# Patient Record
Sex: Female | Born: 1955 | Race: White | Hispanic: No | Marital: Married | Smoking: Former smoker
Health system: Southern US, Community
[De-identification: ages and names within clinical notes are randomized; demographics above are authoritative.]

---

## 1998-04-07 ENCOUNTER — Ambulatory Visit (HOSPITAL_COMMUNITY): Admission: RE | Admit: 1998-04-07 | Discharge: 1998-04-07 | Payer: Self-pay | Admitting: *Deleted

## 1998-12-03 ENCOUNTER — Other Ambulatory Visit: Admission: RE | Admit: 1998-12-03 | Discharge: 1998-12-03 | Payer: Self-pay | Admitting: *Deleted

## 2000-03-16 ENCOUNTER — Other Ambulatory Visit: Admission: RE | Admit: 2000-03-16 | Discharge: 2000-03-16 | Payer: Self-pay | Admitting: *Deleted

## 2000-08-15 ENCOUNTER — Ambulatory Visit (HOSPITAL_COMMUNITY): Admission: RE | Admit: 2000-08-15 | Discharge: 2000-08-15 | Payer: Self-pay | Admitting: *Deleted

## 2000-08-15 ENCOUNTER — Encounter: Payer: Self-pay | Admitting: *Deleted

## 2001-09-19 ENCOUNTER — Encounter: Payer: Self-pay | Admitting: *Deleted

## 2001-09-19 ENCOUNTER — Ambulatory Visit (HOSPITAL_COMMUNITY): Admission: RE | Admit: 2001-09-19 | Discharge: 2001-09-19 | Payer: Self-pay | Admitting: *Deleted

## 2002-02-19 ENCOUNTER — Other Ambulatory Visit: Admission: RE | Admit: 2002-02-19 | Discharge: 2002-02-19 | Payer: Self-pay | Admitting: *Deleted

## 2003-02-03 ENCOUNTER — Ambulatory Visit (HOSPITAL_COMMUNITY): Admission: RE | Admit: 2003-02-03 | Discharge: 2003-02-03 | Payer: Self-pay | Admitting: *Deleted

## 2003-02-03 ENCOUNTER — Encounter: Payer: Self-pay | Admitting: *Deleted

## 2004-07-07 ENCOUNTER — Ambulatory Visit (HOSPITAL_COMMUNITY): Admission: RE | Admit: 2004-07-07 | Discharge: 2004-07-07 | Payer: Self-pay | Admitting: *Deleted

## 2005-05-31 ENCOUNTER — Other Ambulatory Visit: Admission: RE | Admit: 2005-05-31 | Discharge: 2005-05-31 | Payer: Self-pay | Admitting: *Deleted

## 2006-06-06 ENCOUNTER — Other Ambulatory Visit: Admission: RE | Admit: 2006-06-06 | Discharge: 2006-06-06 | Payer: Self-pay | Admitting: Family Medicine

## 2006-09-03 ENCOUNTER — Ambulatory Visit (HOSPITAL_COMMUNITY): Admission: RE | Admit: 2006-09-03 | Discharge: 2006-09-03 | Payer: Self-pay | Admitting: Obstetrics and Gynecology

## 2007-11-20 ENCOUNTER — Ambulatory Visit (HOSPITAL_COMMUNITY): Admission: RE | Admit: 2007-11-20 | Discharge: 2007-11-20 | Payer: Self-pay | Admitting: Family Medicine

## 2008-11-27 ENCOUNTER — Ambulatory Visit (HOSPITAL_COMMUNITY): Admission: RE | Admit: 2008-11-27 | Discharge: 2008-11-27 | Payer: Self-pay | Admitting: Gastroenterology

## 2010-12-30 ENCOUNTER — Ambulatory Visit (HOSPITAL_COMMUNITY)
Admission: RE | Admit: 2010-12-30 | Discharge: 2010-12-30 | Payer: Self-pay | Source: Home / Self Care | Attending: Obstetrics | Admitting: Obstetrics

## 2011-05-02 NOTE — Op Note (Signed)
Sabrina Sabrina [name], Sabrina [name] NO.:  [id number]   MEDICAL RECORD NO.:  [id number]          PATIENT TYPE:  AMB   LOCATION:  ENDO                         FACILITY[facility]  [facility]   PHYSICIAN:  Shirley Friar, MDDATE OF BIRTH:  02/15/1956   DATE OF PROCEDURE:  DATE OF DISCHARGE:                               OPERATIVE REPORT   INDICATIONS:  Screening.   MEDICATIONS:  Fentanyl 125 mcg IV, Versed 12.5 mg IV, Phenergan 6.25 mg  IV.   FINDINGS:  Rectal exam was normal.  Pediatric Pentax colonoscope was  inserted into a well-prepped colon and advanced to the cecum where the  ileocecal valve and appendiceal orifice were identified.  A repeated  loop reduction was necessary in order to reach the cecum due to  redundant colon on the left side of the colon.  The terminal ileum was  intubated and was normal in appearance.  On careful withdrawal of the  colonoscope no mucosal abnormalities were seen.  Retroflexion was  unremarkable.   ASSESSMENT:  Normal colonoscopy.   PLAN:  Repeat colonoscopy in 10 years.  Check FOBT in 5 years.      Shirley Friar, MD  Electronically Signed     VCS/MEDQ  D:  11/27/2008  T:  11/27/2008  Job:  [id number]   cc:   Molly Maduro L. Foy [place], M.D.  Fax: [phone]

## 2012-03-29 ENCOUNTER — Other Ambulatory Visit (HOSPITAL_COMMUNITY): Payer: Self-pay | Admitting: Nurse Practitioner

## 2012-03-29 DIAGNOSIS — Z1231 Encounter for screening mammogram for malignant neoplasm of breast: Secondary | ICD-10-CM

## 2012-04-25 ENCOUNTER — Ambulatory Visit (HOSPITAL_COMMUNITY)
Admission: RE | Admit: 2012-04-25 | Discharge: 2012-04-25 | Disposition: A | Discharge status: Home / Self Care | Payer: BC Managed Care – PPO | Source: Ambulatory Visit | Attending: Nurse Practitioner | Admitting: Nurse Practitioner

## 2012-04-25 DIAGNOSIS — Z1231 Encounter for screening mammogram for malignant neoplasm of breast: Secondary | ICD-10-CM | POA: Insufficient documentation

## 2014-03-10 ENCOUNTER — Other Ambulatory Visit (HOSPITAL_COMMUNITY): Payer: Self-pay | Admitting: Family Medicine

## 2014-03-10 DIAGNOSIS — Z1231 Encounter for screening mammogram for malignant neoplasm of breast: Secondary | ICD-10-CM

## 2014-03-12 ENCOUNTER — Ambulatory Visit (HOSPITAL_COMMUNITY)
Admission: RE | Admit: 2014-03-12 | Discharge: 2014-03-12 | Disposition: A | Discharge status: Home / Self Care | Payer: BC Managed Care – PPO | Source: Ambulatory Visit | Attending: Family Medicine | Admitting: Family Medicine

## 2014-03-12 DIAGNOSIS — Z1231 Encounter for screening mammogram for malignant neoplasm of breast: Secondary | ICD-10-CM | POA: Insufficient documentation

## 2014-06-17 ENCOUNTER — Encounter: Payer: Self-pay | Admitting: Pulmonary Disease

## 2014-06-17 ENCOUNTER — Ambulatory Visit (INDEPENDENT_AMBULATORY_CARE_PROVIDER_SITE_OTHER): Payer: BC Managed Care – PPO | Admitting: Pulmonary Disease

## 2014-06-17 VITALS — BP 122/78 | HR 68 | Temp 98.0°F | Ht 66.0 in | Wt 143.0 lb

## 2014-06-17 DIAGNOSIS — G4700 Insomnia, unspecified: Secondary | ICD-10-CM

## 2014-06-17 DIAGNOSIS — G478 Other sleep disorders: Secondary | ICD-10-CM

## 2014-06-17 NOTE — Progress Notes (Signed)
Subjective:    Patient ID: Sabrina [name], female    DOB: Apr 25, 1956, 58 y.o.   MRN: [id number]  HPI  58 year old woman presents for evaluation of sleep disordered breathing. She reports restless and non-refreshing sleep and frequent nocturnal arousals. She reports feeling sluggish On awakening since her teenage years. She reports nocturnal awakenings increase during menopause and have persisted since. 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 She drinks a glass of wine every evening and has been trying to cut down. There is no history of witnessed apneas. Mild snoring has been reported by her husband. She reports occasional leg cramps that have improved with hydration. She tried Ambien for some time which reduced sleep onset, she felt that she was getting immune to this. Bedtime is around 11 PM no, she sleeps for 10-20 minutes on her ipad, sleep latency is minimal, she sleeps on her side with one pillow, reports 4-5 awakenings with occasional bathroom visits, without post void sleep latency, is out of bed by 7 AM feeling sluggish and heavyheaded. Her husband brings her coffee in bed.  She drinks 2-3 cups of coffee in the morning before she gets going. On weekends bedtime is around midnight and she wakes up by 8 AM, but feels sluggish regardless.  There is no history suggestive of cataplexy, sleep paralysis or parasomnias   No past medical history on file.  No past surgical history on file.  No Known Allergies  History   Social History  . Marital Status: Married    Spouse Name: N/A    Number of Children: 2  . Years of Education: N/A   Occupational History  . [employer]    Social History Main Topics  . Smoking status: Former Smoker -- 1.50 packs/day for 5 years    Types: Cigarettes    Quit date: 12/18/1976  . Smokeless tobacco: Not on file  . Alcohol Use: Yes     Comment: 3-4 days of week red  wine  . Drug Use: No  . Sexual Activity: Not on file   Other Topics Concern  . Not on file   Social History Narrative  . No narrative on file    Family History  Problem Relation Age of Onset  . Heart disease Mother   . Diabetes Mother     Review of Systems  Constitutional: Negative for fever and unexpected weight change.  HENT: Negative for congestion, dental problem, ear pain, nosebleeds, postnasal drip, rhinorrhea, sinus pressure, sneezing, sore throat and trouble swallowing.   Eyes: Negative for redness and itching.  Respiratory: Positive for shortness of breath. Negative for cough, chest tightness and wheezing.   Cardiovascular: Negative for palpitations and leg swelling.  Gastrointestinal: Negative for nausea and vomiting.  Genitourinary: Negative for dysuria.  Musculoskeletal: Negative for joint swelling.  Skin: Negative for rash.  Neurological: Negative for headaches.  Hematological: Does not bruise/bleed easily.  Psychiatric/Behavioral: Negative for dysphoric mood. The patient is not nervous/anxious.        Objective:   Physical Exam  Gen. Pleasant, well-nourished, in no distress, normal affect ENT - no lesions, no post nasal drip Neck: No JVD, no thyromegaly, no carotid bruits Lungs: no use of accessory muscles, no dullness to percussion, clear without rales or rhonchi  Cardiovascular: Rhythm regular, heart sounds  normal, no murmurs or gallops, no peripheral edema Abdomen: soft and non-tender, no hepatosplenomegaly, BS normal. Musculoskeletal: No deformities, no cyanosis or  clubbing Neuro:  alert, non focal       Assessment & Plan:

## 2014-06-17 NOTE — Assessment & Plan Note (Signed)
I'm not unable to pin point the cause of her frequent awakenings. She does not seem to have obvious sleep disordered breathing, does not have witnessed apneas-although snoring and unrefreshing sleep may be an indicator. She does not have a history suggestive of restless leg syndrome. Her sleep quantity appears okay, she may require more than 8 hours of sleep every night. We discussed sleep hygiene -work hours - alcohol at bedtime -exercise -light exposure -work balance -She has tried melatonin without significant benefits in the past  She will call us back in the future if problems persist and we will proceed with a sleep study.

## 2014-06-17 NOTE — Patient Instructions (Signed)
We discussed sleep hygiene -work hours - alcohol at bedtime -exercise -light exposure -work balance

## 2014-07-13 ENCOUNTER — Other Ambulatory Visit: Payer: Self-pay | Admitting: Nurse Practitioner

## 2014-07-13 DIAGNOSIS — Z780 Asymptomatic menopausal state: Secondary | ICD-10-CM

## 2016-05-08 ENCOUNTER — Other Ambulatory Visit: Payer: Self-pay

## 2016-05-08 DIAGNOSIS — Z1231 Encounter for screening mammogram for malignant neoplasm of breast: Secondary | ICD-10-CM

## 2016-05-26 ENCOUNTER — Ambulatory Visit
Admission: RE | Admit: 2016-05-26 | Discharge: 2016-05-26 | Disposition: A | Discharge status: Home / Self Care | Payer: BLUE CROSS/BLUE SHIELD | Source: Ambulatory Visit

## 2016-05-26 DIAGNOSIS — Z1231 Encounter for screening mammogram for malignant neoplasm of breast: Secondary | ICD-10-CM

## 2017-10-12 ENCOUNTER — Other Ambulatory Visit: Payer: Self-pay | Admitting: Family Medicine

## 2017-10-12 DIAGNOSIS — Z139 Encounter for screening, unspecified: Secondary | ICD-10-CM

## 2017-11-05 ENCOUNTER — Ambulatory Visit
Admission: RE | Admit: 2017-11-05 | Discharge: 2017-11-05 | Disposition: A | Discharge status: Home / Self Care | Payer: BLUE CROSS/BLUE SHIELD | Source: Ambulatory Visit | Attending: Family Medicine | Admitting: Family Medicine

## 2017-11-05 DIAGNOSIS — Z139 Encounter for screening, unspecified: Secondary | ICD-10-CM

## 2020-10-11 ENCOUNTER — Other Ambulatory Visit: Payer: Self-pay | Admitting: Physician Assistant

## 2020-10-11 DIAGNOSIS — E2839 Other primary ovarian failure: Secondary | ICD-10-CM

## 2020-10-11 DIAGNOSIS — Z1231 Encounter for screening mammogram for malignant neoplasm of breast: Secondary | ICD-10-CM

## 2021-01-24 ENCOUNTER — Other Ambulatory Visit: Payer: BLUE CROSS/BLUE SHIELD

## 2021-01-24 ENCOUNTER — Ambulatory Visit: Payer: BLUE CROSS/BLUE SHIELD

## 2021-01-26 ENCOUNTER — Ambulatory Visit
Admission: RE | Admit: 2021-01-26 | Discharge: 2021-01-26 | Disposition: A | Discharge status: Home / Self Care | Payer: Medicare Other | Source: Ambulatory Visit | Attending: Physician Assistant | Admitting: Physician Assistant

## 2021-01-26 ENCOUNTER — Other Ambulatory Visit: Payer: Self-pay

## 2021-01-26 DIAGNOSIS — E2839 Other primary ovarian failure: Secondary | ICD-10-CM

## 2021-02-08 ENCOUNTER — Other Ambulatory Visit: Payer: Self-pay

## 2021-02-08 ENCOUNTER — Ambulatory Visit
Admission: RE | Admit: 2021-02-08 | Discharge: 2021-02-08 | Disposition: A | Discharge status: Home / Self Care | Payer: Medicare Other | Source: Ambulatory Visit | Attending: Physician Assistant | Admitting: Physician Assistant

## 2021-02-08 DIAGNOSIS — Z1231 Encounter for screening mammogram for malignant neoplasm of breast: Secondary | ICD-10-CM

## 2021-03-03 ENCOUNTER — Ambulatory Visit: Payer: BLUE CROSS/BLUE SHIELD

## 2021-06-06 ENCOUNTER — Other Ambulatory Visit: Payer: BLUE CROSS/BLUE SHIELD

## 2022-03-30 ENCOUNTER — Other Ambulatory Visit: Payer: Self-pay | Admitting: Physician Assistant

## 2022-03-30 DIAGNOSIS — Z1231 Encounter for screening mammogram for malignant neoplasm of breast: Secondary | ICD-10-CM

## 2022-04-11 ENCOUNTER — Ambulatory Visit
Admission: RE | Admit: 2022-04-11 | Discharge: 2022-04-11 | Disposition: A | Discharge status: Home / Self Care | Payer: Medicare Other | Source: Ambulatory Visit | Attending: Physician Assistant | Admitting: Physician Assistant

## 2022-04-11 DIAGNOSIS — Z1231 Encounter for screening mammogram for malignant neoplasm of breast: Secondary | ICD-10-CM

## 2023-04-16 ENCOUNTER — Other Ambulatory Visit: Payer: Self-pay | Admitting: Physician Assistant

## 2023-04-16 DIAGNOSIS — Z1231 Encounter for screening mammogram for malignant neoplasm of breast: Secondary | ICD-10-CM

## 2023-05-22 ENCOUNTER — Ambulatory Visit: Payer: Medicare Other

## 2023-11-09 IMAGING — MG MM DIGITAL SCREENING BILAT W/ TOMO AND CAD
6 of 10 series · 6 of 30 positions shown · non-contrast
Comparison: Previous exam(s).

CLINICAL DATA: Screening.

EXAM:
DIGITAL SCREENING BILATERAL MAMMOGRAM WITH TOMOSYNTHESIS AND CAD
TECHNIQUE: Bilateral screening digital craniocaudal and mediolateral oblique
mammograms were obtained. Bilateral screening digital breast
tomosynthesis was performed. The images were evaluated with
computer-aided detection.

[L CC synth-2D]
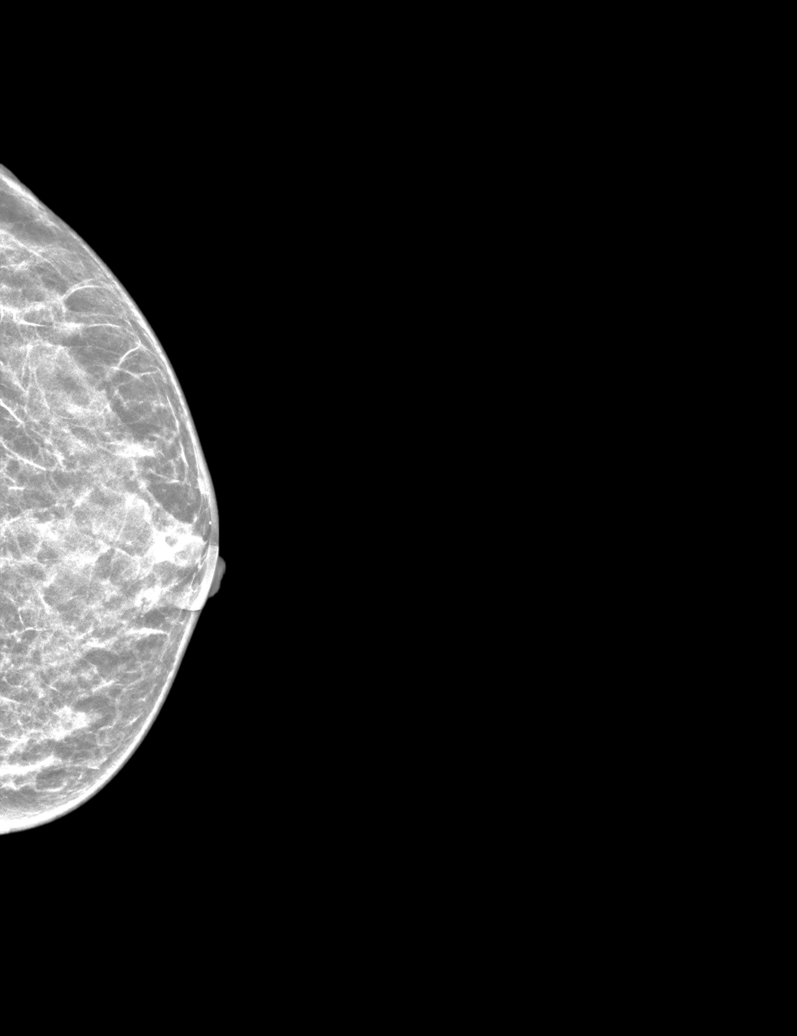

[R MLO synth-2D]
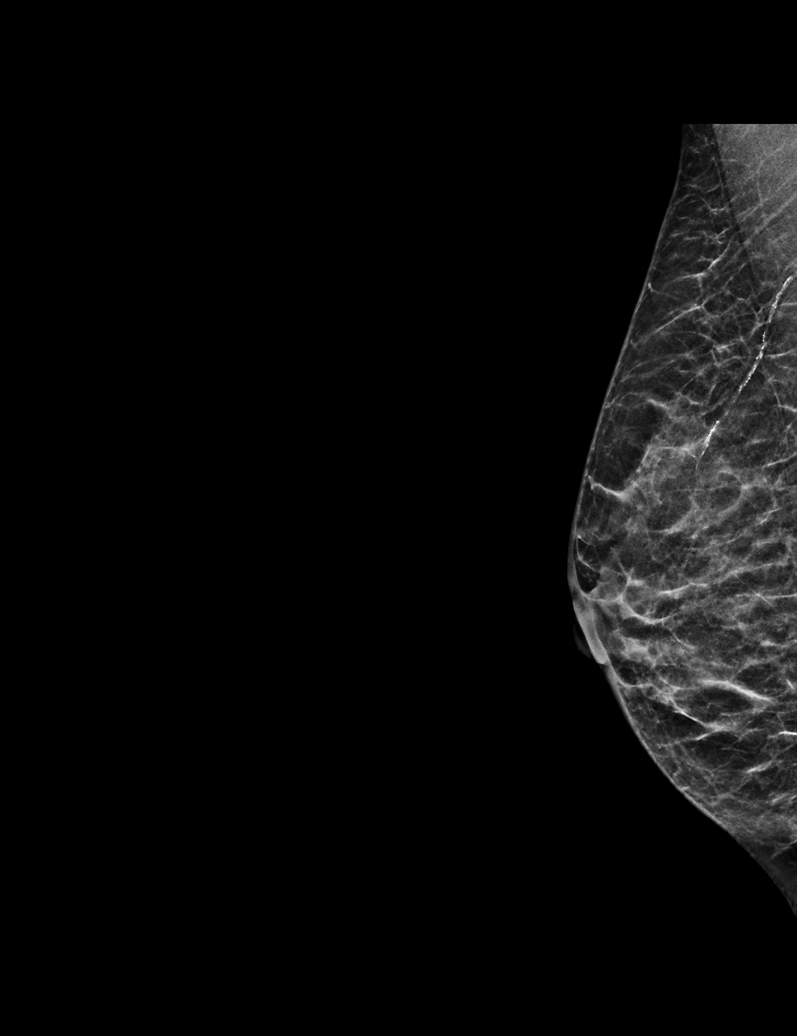

[R CC synth-2D]
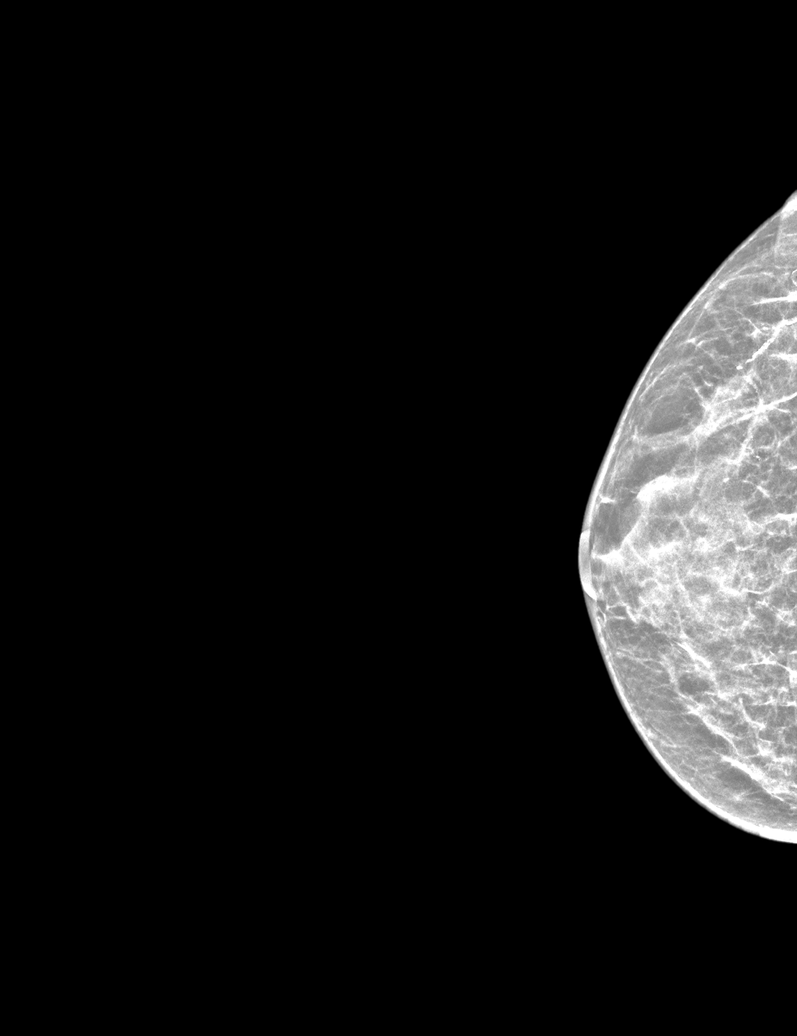

[L MLO synth-2D (1 of 2)]
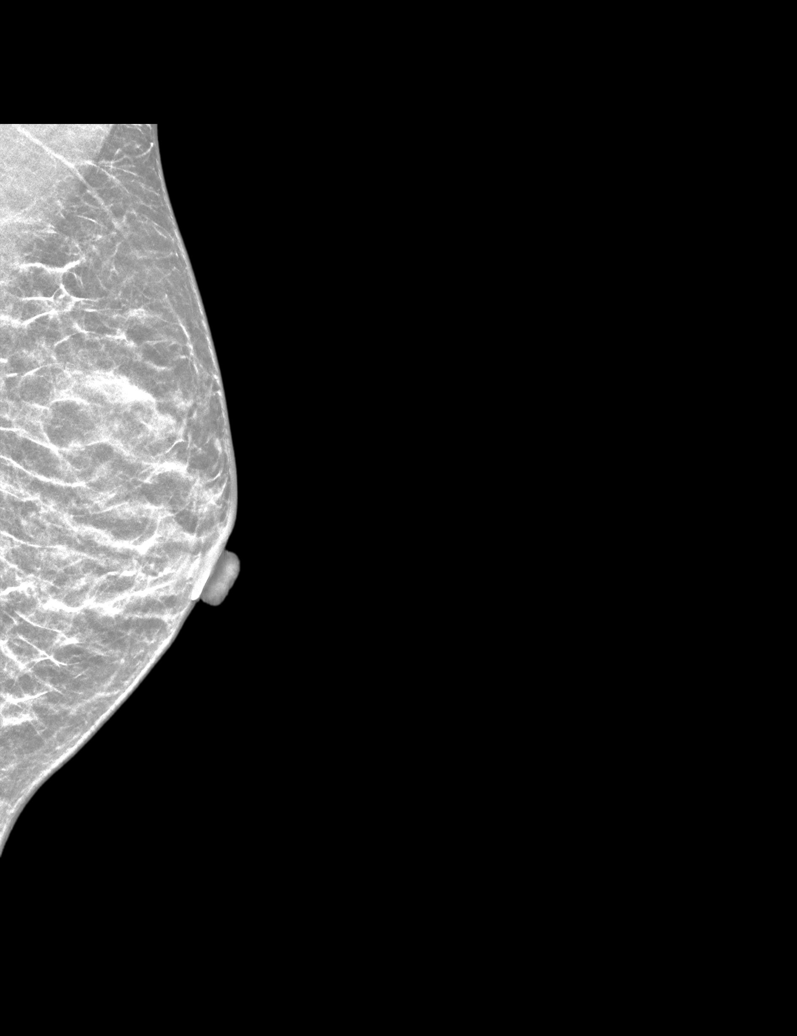

[L MLO synth-2D (2 of 2)]
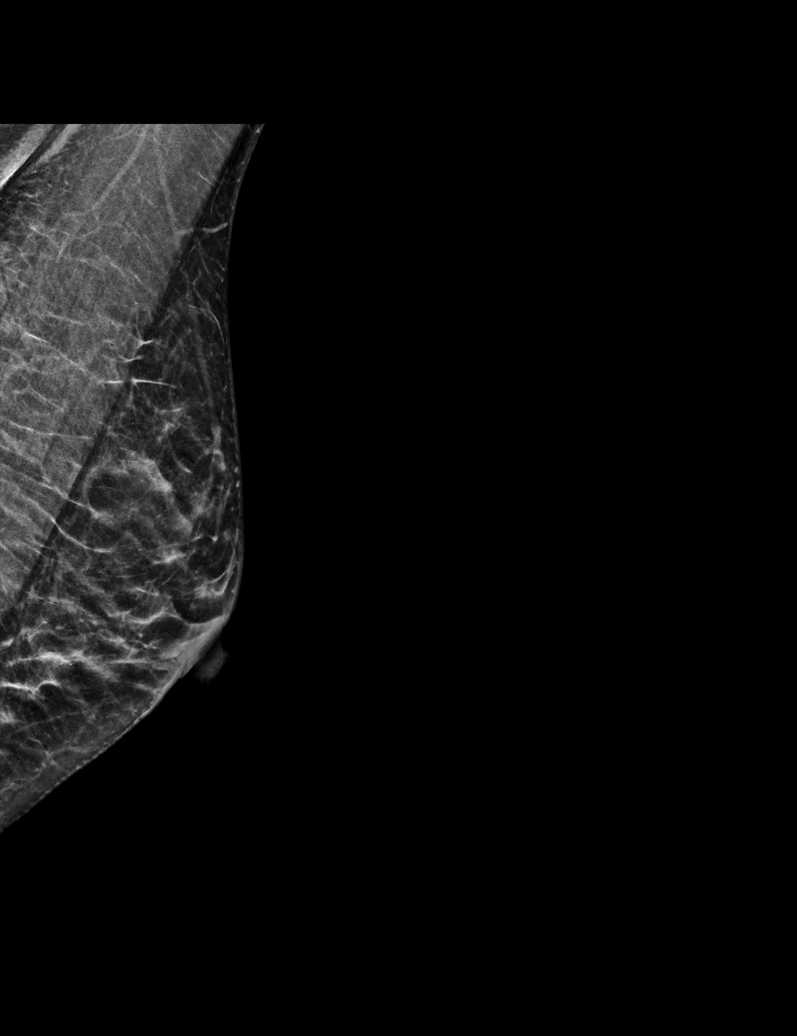

[L CC tomo · tomo slice 17/34.0]
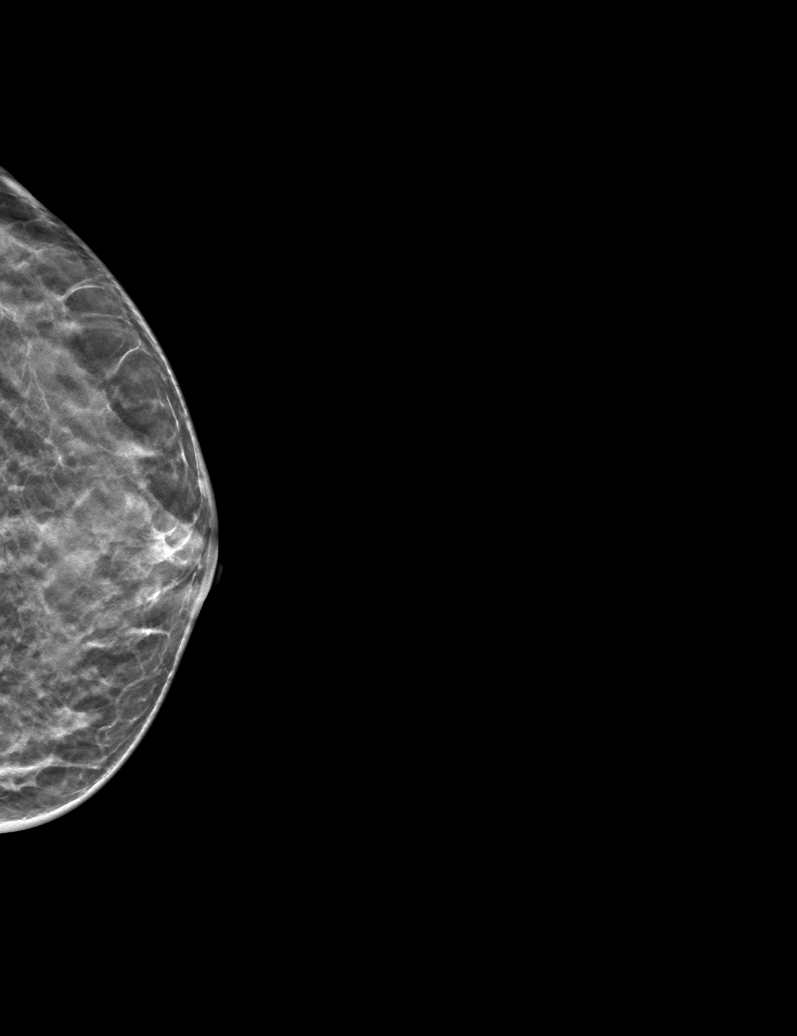

[6 of 30 positions shown; findings below may reference images not displayed]

ACR Breast Density Category c: The breast tissue is heterogeneously
dense, which may obscure small masses.
FINDINGS: There are no findings suspicious for malignancy.
IMPRESSION: No mammographic evidence of malignancy. A result letter of this
screening mammogram will be mailed directly to the patient.

RECOMMENDATION:
Screening mammogram in one year. (Code:Q3-W-BC3)

BI-RADS CATEGORY  1: Negative.
